# Patient Record
Sex: Male | Born: 1985 | Race: White | Hispanic: No | Marital: Single | Smoking: Current some day smoker
Health system: Southern US, Community
[De-identification: ages and names within clinical notes are randomized; demographics above are authoritative.]

## PROBLEM LIST (undated history)

## (undated) HISTORY — PX: DENTAL SURGERY: SHX609

## (undated) HISTORY — PX: SALIVARY GLAND SURGERY: SHX768

---

## 2016-10-28 ENCOUNTER — Other Ambulatory Visit: Payer: Self-pay | Admitting: Otolaryngology

## 2016-10-28 DIAGNOSIS — K1121 Acute sialoadenitis: Secondary | ICD-10-CM

## 2016-10-28 DIAGNOSIS — K115 Sialolithiasis: Secondary | ICD-10-CM

## 2016-11-05 ENCOUNTER — Ambulatory Visit
Admission: RE | Admit: 2016-11-05 | Discharge: 2016-11-05 | Disposition: A | Discharge status: Home / Self Care | Payer: Self-pay | Source: Ambulatory Visit | Attending: Otolaryngology | Admitting: Otolaryngology

## 2016-11-05 DIAGNOSIS — K1121 Acute sialoadenitis: Secondary | ICD-10-CM

## 2016-11-05 DIAGNOSIS — K118 Other diseases of salivary glands: Secondary | ICD-10-CM | POA: Insufficient documentation

## 2016-11-05 DIAGNOSIS — K115 Sialolithiasis: Secondary | ICD-10-CM

## 2016-11-05 MED ORDER — IOPAMIDOL (ISOVUE-300) INJECTION 61%
75.0000 mL | Freq: Once | INTRAVENOUS | Status: AC | PRN
  Administered 2016-11-05: 75 mL via INTRAVENOUS

## 2018-07-24 IMAGING — CT CT NECK W/ CM
2 of 3 series · 7 of 14 positions shown, 8 images · IV contrast (iopamidol)
Comparison: None.

CLINICAL DATA: Sialadenitis. Tongue swelling. Left neck pain.
History of submandibular gland resection as a child.

EXAM:
CT NECK WITH CONTRAST
TECHNIQUE: Multidetector CT imaging of the neck was performed using the
standard protocol following the bolus administration of intravenous
contrast.
CONTRAST:  75mL DJEVXK-4OO IOPAMIDOL (DJEVXK-4OO) INJECTION 61%

[Series 2: axial neck · axial · 0.62mm/px · z∈[-298,-178]mm · 3 of 120 slices shown]
[im 30/120  bone]
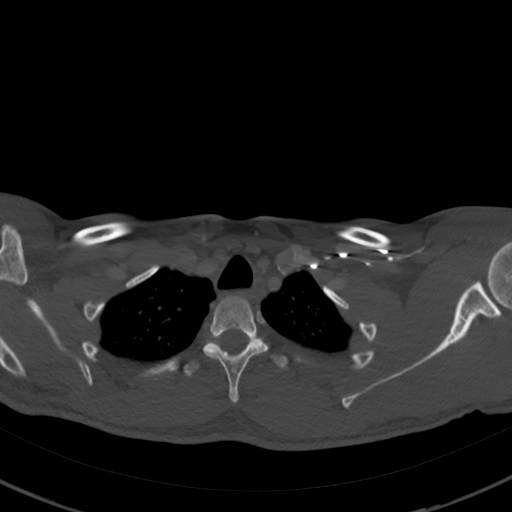
[im 60/120  bone]
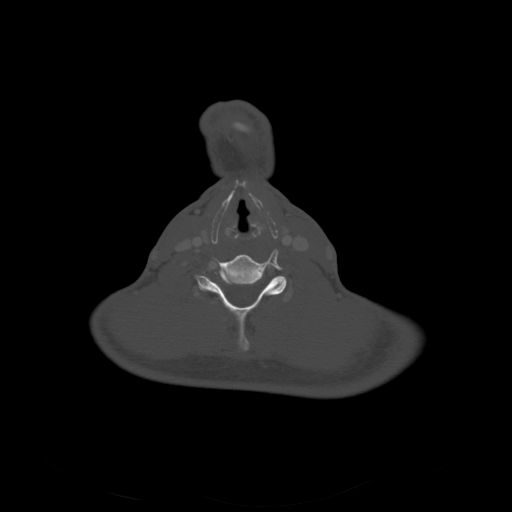
[im 90/120  bone]
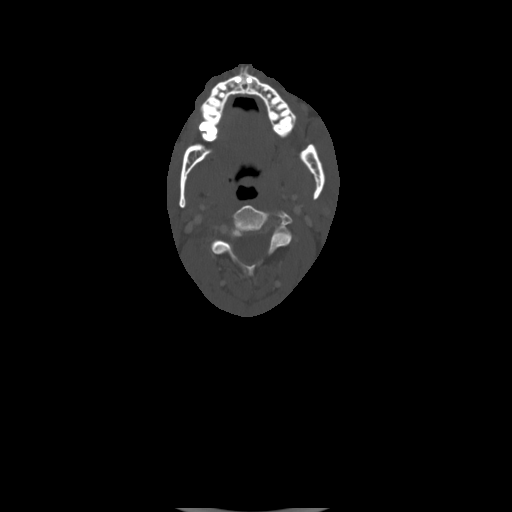

[Series 7: orthogonal ax · axial · 0.45mm/px · z∈[-345,-185]mm · 4 of 142 slices shown, 5 images]
[im 29/142  soft-tissue]
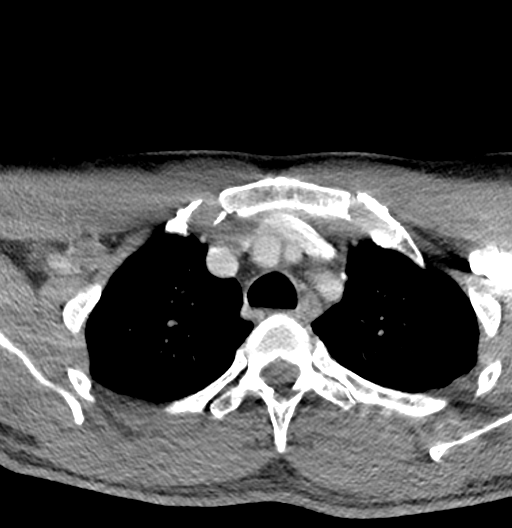
[im 29/142  bone]
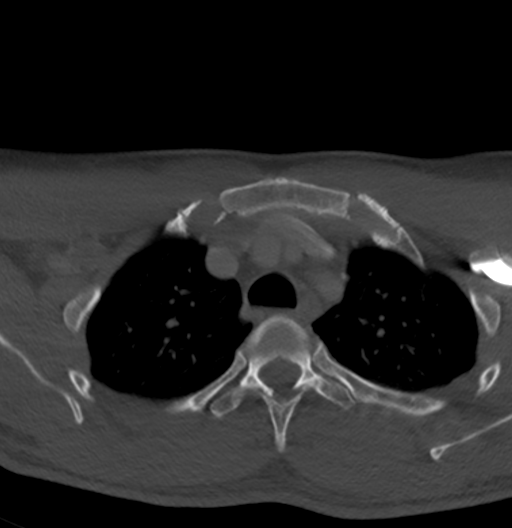
[im 57/142  bone]
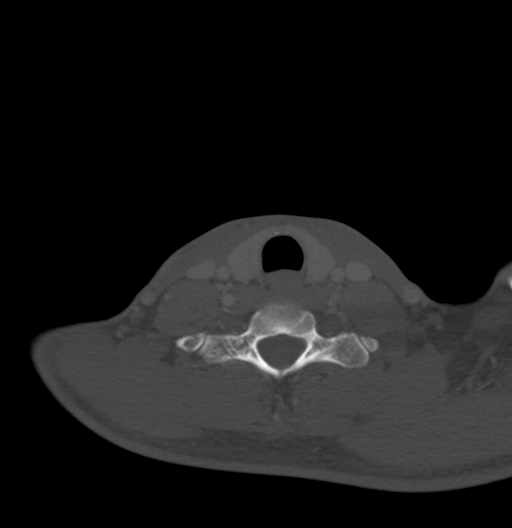
[im 85/142  bone]
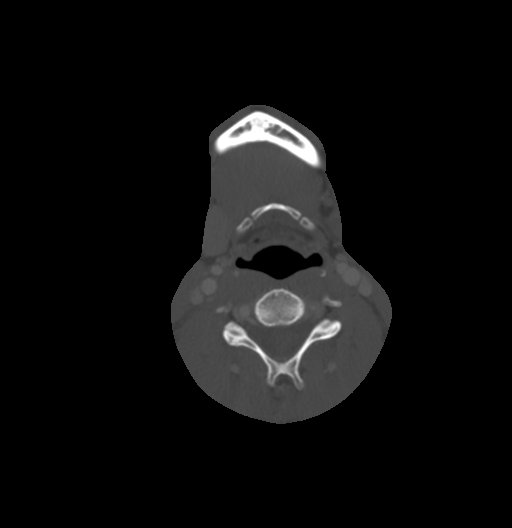
[im 113/142  bone]
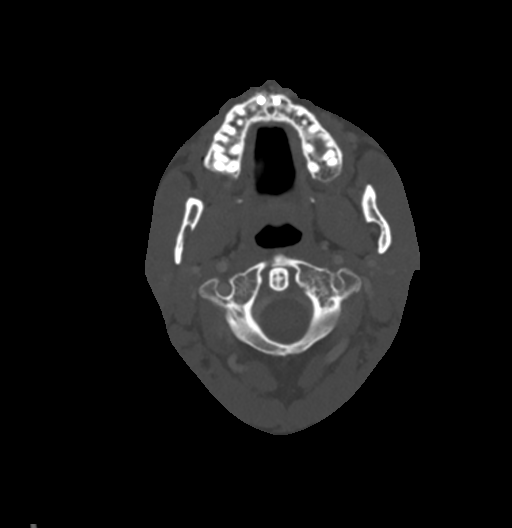

[7 of 14 positions shown; findings below may reference images not displayed]

FINDINGS: Pharynx and larynx: Symmetric pharyngeal soft tissues without
evidence of mass. No retropharyngeal fluid collection. Unremarkable
larynx.

Salivary glands: The parotid glands and right submandibular gland
are unremarkable. The left submandibular gland is absent. There is
mild diffuse dilatation of the mid and anterior aspects of the left
submandibular duct without calculi identified. No gross inflammatory
changes are identified in the floor of mouth or submandibular
spaces.

Thyroid: Unremarkable.

Lymph nodes: No enlarged or suspicious lymph nodes in the neck.

Vascular: Major vascular structures of the neck are patent. The left
vertebral artery is hypoplastic.

Limited intracranial: Unremarkable.

Visualized orbits: Unremarkable.

Mastoids and visualized paranasal sinuses: Mildly polypoid mucosal
thickening with internal calcification in the inferior right
maxillary sinus. Clear mastoid air cells.

Skeleton: No acute osseous abnormality or suspicious osseous lesion.

Upper chest: Clear lung apices.

Other: None.
IMPRESSION: Prior left submandibular gland resection. Mild dilatation of the
left submandibular duct without evidence of calculi.

## 2018-10-18 ENCOUNTER — Other Ambulatory Visit: Payer: Self-pay

## 2018-10-18 ENCOUNTER — Other Ambulatory Visit
Admission: RE | Admit: 2018-10-18 | Discharge: 2018-10-18 | Disposition: A | Discharge status: Home / Self Care | Payer: HRSA Program | Source: Ambulatory Visit | Attending: Otolaryngology | Admitting: Otolaryngology

## 2018-10-18 ENCOUNTER — Encounter: Payer: Self-pay | Admitting: *Deleted

## 2018-10-18 DIAGNOSIS — J342 Deviated nasal septum: Secondary | ICD-10-CM | POA: Insufficient documentation

## 2018-10-18 DIAGNOSIS — Z01812 Encounter for preprocedural laboratory examination: Secondary | ICD-10-CM | POA: Diagnosis not present

## 2018-10-18 DIAGNOSIS — Z20828 Contact with and (suspected) exposure to other viral communicable diseases: Secondary | ICD-10-CM | POA: Insufficient documentation

## 2018-10-18 LAB — SARS CORONAVIRUS 2 (TAT 6-24 HRS): SARS Coronavirus 2: NEGATIVE

## 2018-10-21 ENCOUNTER — Ambulatory Visit
Admission: RE | Admit: 2018-10-21 | Discharge: 2018-10-21 | Disposition: A | Discharge status: Home / Self Care | Payer: Self-pay | Attending: Otolaryngology | Admitting: Otolaryngology

## 2018-10-21 ENCOUNTER — Encounter
Admission: RE | Disposition: A | Discharge status: Home / Self Care | Payer: Self-pay | Source: Home / Self Care | Attending: Otolaryngology

## 2018-10-21 ENCOUNTER — Ambulatory Visit: Payer: Self-pay | Admitting: Anesthesiology

## 2018-10-21 ENCOUNTER — Other Ambulatory Visit: Payer: Self-pay

## 2018-10-21 DIAGNOSIS — J343 Hypertrophy of nasal turbinates: Secondary | ICD-10-CM | POA: Insufficient documentation

## 2018-10-21 DIAGNOSIS — Z79899 Other long term (current) drug therapy: Secondary | ICD-10-CM | POA: Insufficient documentation

## 2018-10-21 DIAGNOSIS — J342 Deviated nasal septum: Secondary | ICD-10-CM | POA: Insufficient documentation

## 2018-10-21 HISTORY — DX: Hemochromatosis, unspecified: E83.119

## 2018-10-21 HISTORY — PX: TURBINATE REDUCTION: SHX6157

## 2018-10-21 HISTORY — PX: SEPTOPLASTY: SHX2393

## 2018-10-21 SURGERY — SEPTOPLASTY, NOSE
Anesthesia: General | Site: Nose | Laterality: Bilateral

## 2018-10-21 MED ORDER — OXYCODONE HCL 5 MG PO TABS: 5.0000 mg | ORAL_TABLET | Freq: Once | ORAL | Status: AC | PRN

## 2018-10-21 MED ORDER — DEXAMETHASONE SODIUM PHOSPHATE 4 MG/ML IJ SOLN
INTRAMUSCULAR | Status: DC | PRN
  Administered 2018-10-21: 10 mg via INTRAVENOUS

## 2018-10-21 MED ORDER — SUCCINYLCHOLINE CHLORIDE 20 MG/ML IJ SOLN
INTRAMUSCULAR | Status: DC | PRN
  Administered 2018-10-21: 100 mg via INTRAVENOUS

## 2018-10-21 MED ORDER — FENTANYL CITRATE (PF) 100 MCG/2ML IJ SOLN: 25.0000 ug | INTRAMUSCULAR | Status: DC | PRN

## 2018-10-21 MED ORDER — LACTATED RINGERS IV SOLN
INTRAVENOUS | Status: DC
  Administered 2018-10-21 (×2): via INTRAVENOUS

## 2018-10-21 MED ORDER — DEXTROSE 5 % IV SOLN
2000.0000 mg | Freq: Once | INTRAVENOUS | Status: AC
  Administered 2018-10-21: 13:00:00 2000 mg via INTRAVENOUS

## 2018-10-21 MED ORDER — PROPOFOL 10 MG/ML IV BOLUS
INTRAVENOUS | Status: DC | PRN
  Administered 2018-10-21: 200 mg via INTRAVENOUS

## 2018-10-21 MED ORDER — GLYCOPYRROLATE 0.2 MG/ML IJ SOLN
INTRAMUSCULAR | Status: DC | PRN
  Administered 2018-10-21: 0.1 mg via INTRAVENOUS

## 2018-10-21 MED ORDER — ACETAMINOPHEN 10 MG/ML IV SOLN
1000.0000 mg | Freq: Once | INTRAVENOUS | Status: AC
  Administered 2018-10-21: 12:00:00 1000 mg via INTRAVENOUS

## 2018-10-21 MED ORDER — LIDOCAINE HCL (CARDIAC) PF 100 MG/5ML IV SOSY
PREFILLED_SYRINGE | INTRAVENOUS | Status: DC | PRN
  Administered 2018-10-21: 30 mg via INTRAVENOUS

## 2018-10-21 MED ORDER — ONDANSETRON HCL 4 MG/2ML IJ SOLN: 4.0000 mg | Freq: Once | INTRAMUSCULAR | Status: DC | PRN

## 2018-10-21 MED ORDER — LIDOCAINE-EPINEPHRINE 1 %-1:100000 IJ SOLN
INTRAMUSCULAR | Status: DC | PRN
  Administered 2018-10-21: 8 mL

## 2018-10-21 MED ORDER — ONDANSETRON HCL 4 MG/2ML IJ SOLN
INTRAMUSCULAR | Status: DC | PRN
  Administered 2018-10-21: 4 mg via INTRAVENOUS

## 2018-10-21 MED ORDER — ACETAMINOPHEN 10 MG/ML IV SOLN
INTRAVENOUS | Status: DC | PRN
  Administered 2018-10-21: 1000 mg via INTRAVENOUS

## 2018-10-21 MED ORDER — MIDAZOLAM HCL 5 MG/5ML IJ SOLN
INTRAMUSCULAR | Status: DC | PRN
  Administered 2018-10-21: 2 mg via INTRAVENOUS

## 2018-10-21 MED ORDER — PHENYLEPHRINE HCL 0.5 % NA SOLN
NASAL | Status: DC | PRN
  Administered 2018-10-21: 15 mL via TOPICAL

## 2018-10-21 MED ORDER — FENTANYL CITRATE (PF) 100 MCG/2ML IJ SOLN
INTRAMUSCULAR | Status: DC | PRN
  Administered 2018-10-21: 100 ug via INTRAVENOUS

## 2018-10-21 MED ORDER — OXYMETAZOLINE HCL 0.05 % NA SOLN
2.0000 | Freq: Once | NASAL | Status: AC
  Administered 2018-10-21: 13:00:00 2 via NASAL

## 2018-10-21 MED ORDER — SCOPOLAMINE 1 MG/3DAYS TD PT72
1.0000 | MEDICATED_PATCH | Freq: Once | TRANSDERMAL | Status: DC
  Administered 2018-10-21: 12:00:00 1.5 mg via TRANSDERMAL

## 2018-10-21 MED ORDER — OXYCODONE HCL 5 MG/5ML PO SOLN
5.0000 mg | Freq: Once | ORAL | Status: AC | PRN
  Administered 2018-10-21: 15:00:00 5 mg via ORAL

## 2018-10-21 SURGICAL SUPPLY — 24 items
CANISTER SUCT 1200ML W/VALVE (MISCELLANEOUS) ×3 IMPLANT
COAGULATOR SUCT 8FR VV (MISCELLANEOUS) ×3 IMPLANT
ELECT REM PT RETURN 9FT ADLT (ELECTROSURGICAL) ×3
ELECTRODE REM PT RTRN 9FT ADLT (ELECTROSURGICAL) ×1 IMPLANT
GLOVE PI ULTRA LF STRL 7.5 (GLOVE) ×2 IMPLANT
GLOVE PI ULTRA NON LATEX 7.5 (GLOVE) ×4
GOWN STRL REUS W/ TWL LRG LVL3 (GOWN DISPOSABLE) ×1 IMPLANT
GOWN STRL REUS W/TWL LRG LVL3 (GOWN DISPOSABLE) ×2
KIT TURNOVER KIT A (KITS) ×3 IMPLANT
NEEDLE ANESTHESIA  27G X 3.5 (NEEDLE) ×2
NEEDLE ANESTHESIA 27G X 3.5 (NEEDLE) ×1 IMPLANT
NEEDLE HYPO 27GX1-1/4 (NEEDLE) ×3 IMPLANT
PACK ENT CUSTOM (PACKS) ×3 IMPLANT
PATTIES SURGICAL .5 X3 (DISPOSABLE) ×3 IMPLANT
SPLINT NASAL SEPTAL BLV .50 ST (MISCELLANEOUS) ×3 IMPLANT
STRAP BODY AND KNEE 60X3 (MISCELLANEOUS) ×3 IMPLANT
SUT CHROMIC 3-0 (SUTURE)
SUT CHROMIC 3-0 KS 27XMFL CR (SUTURE)
SUT ETHILON 3-0 KS 30 BLK (SUTURE) ×3 IMPLANT
SUT PLAIN GUT 4-0 (SUTURE) ×3 IMPLANT
SUTURE CHRMC 3-0 KS 27XMFL CR (SUTURE) IMPLANT
SYR 3ML LL SCALE MARK (SYRINGE) ×3 IMPLANT
TOWEL OR 17X26 4PK STRL BLUE (TOWEL DISPOSABLE) ×3 IMPLANT
WATER STERILE IRR 250ML POUR (IV SOLUTION) ×3 IMPLANT

## 2018-10-21 NOTE — Op Note (Signed)
10/21/2018  2:44 PM [id number]   Pre-Op Dx:  Deviated Nasal Septum, Hypertrophic Inferior Turbinates  Post-op Dx: Same  Proc: Nasal Septoplasty, Bilateral Partial Reduction Inferior Turbinates   Surg:  Chad Brennan  Anes:  GOT  EBL: 50 mL  Comp: None  Findings: Septum buckled to the right side with maxillary crest and cartilage pushing into the right nasal airway.  Posteriorly he has a bony septal spur to his left side blocking some of the posterior airway on the left.  He has enlarged inferior turbinates  Procedure: With the patient in a comfortable supine position,  general orotracheal anesthesia was induced without difficulty.     The patient received preoperative Afrin spray for topical decongestion and vasoconstriction.  Intravenous prophylactic antibiotics were administered.  At an appropriate level, the patient was placed in a semi-sitting position.  Nasal vibrissae were trimmed.   1% Xylocaine with 1:100,000 epinephrine, 8 cc's, was infiltrated into the anterior floor of the nose, into the nasal spine region, into the membranous columella, and finally into the submucoperichondrial plane of the septum on both sides.  Several minutes were allowed for this to take effect.  Cottoniod pledgetts soaked in Afrin and 4% Xylocaine were placed into both nasal cavities and left while the patient was prepped and draped in the standard fashion.  The materials were removed from the nose and observed to be intact and correct in number.  The nose was inspected with a headlight and zero degree scope with the findings as described above.  A left Killian incision was sharply executed and carried down to the quadrangular cartilage. The mucoperichondrium was elelvated along the quadrangular plate back to the bony-cartilaginous junction. The mucoperiostium was then elevated along the ethmoid plate and the vomer. The boney-catilaginous junction was then split with a freer elevator and the  mucoperiosteum was elevated on the opposite side. The mucoperiosteum was then elevated along the maxillary crest as needed to expose the crooked bone of the crest.  Boney spurs of the vomer and maxillary crest were removed with Lenoria Chime forceps.  The cartilaginous plate was trimmed along its posterior and inferior borders of about 2 mm of cartilage to free it up inferiorly. Some of the deviated ethmoid plate was then fractured and removed with Takahashi forceps to free up the posterior border of the quadrangular plate and allow it to swing back to the midline. The mucosal flaps were placed back into their anatomic position to allow visualization of the airways. The septum now sat in the midline with an improved airway.  A 3-0 Chromic suture on a Keith needle in used to anchor the inferior septum at the nasal spine with a through and through suture. The mucosal flaps are then sutured together using a through and through whip stitch of 4-0 [place] with a mini-Keith needle. This was used to close the [place] incision as well.   The inferior turbinates were then inspected. An incision was created along the inferior aspect of the left inferior turbinate with removal of some of the inferior soft tissue and bone. Electrocautery was used to control bleeding in the area. The remaining turbinate was then outfractured to open up the airway further. There was no significant bleeding noted. The right turbinate was then trimmed and outfractured in a similar fashion.  The airways were then visualized and showed open passageways on both sides that were significantly improved compared to before surgery. There was no signifcant bleeding. Nasal splints were applied to both sides  of the septum using Xomed 0.31mm regular sized splints that were trimmed, and then held in position with a 3-0 Nylon through and through suture.  The patient was turned back over to anesthesia, and awakened, extubated, and taken to the PACU in  satisfactory condition.  Dispo:   PACU to home  Plan: Ice, elevation, narcotic analgesia, steroid taper, and prophylactic antibiotics for the duration of indwelling nasal foreign bodies.  We will reevaluate the patient in the office in 6 days and remove the septal splints.  Return to work in 10 days, strenuous activities in two weeks.   Elon Alas Joffrey Kerce 10/21/2018 2:44 PM

## 2018-10-21 NOTE — Discharge Instructions (Signed)
[facility] ENDOSCOPIC SINUS SURGERY [facility]  What is Functional Endoscopic Sinus Surgery?  The Surgery involves making the natural openings of the sinuses larger by removing the bony partitions that separate the sinuses from the nasal cavity.  The natural sinus lining is preserved as much as possible to allow the sinuses to resume normal function after the surgery.  In some patients nasal polyps (excessively swollen lining of the sinuses) may be removed to relieve obstruction of the sinus openings.  The surgery is performed through the nose using lighted scopes, which eliminates the need for incisions on the face.  A septoplasty is a different procedure which is sometimes performed with sinus surgery.  It involves straightening the boy partition that separates the two sides of your nose.  A crooked or deviated septum may need repair if is obstructing the sinuses or nasal airflow.  Turbinate reduction is also often performed during sinus surgery.  The turbinates are bony proturberances from the side walls of the nose which swell and can obstruct the nose in patients with sinus and allergy problems.  Their size can be surgically reduced to help relieve nasal obstruction.  What Can Sinus Surgery Do For Me?  Sinus surgery can reduce the frequency of sinus infections requiring antibiotic treatment.  This can provide improvement in nasal congestion, post-nasal drainage, facial pressure and nasal obstruction.  Surgery will NOT prevent you from ever having an infection again, so it usually only for patients who get infections 4 or more times yearly requiring antibiotics, or for infections that do not clear with antibiotics.  It will not cure nasal allergies, so patients with allergies may still require medication to treat their allergies after surgery. Surgery may improve headaches related to sinusitis, however, some people will continue to  require medication to control sinus headaches related to allergies.  Surgery will do nothing for other forms of headache (migraine, tension or cluster).  What Are the Risks of Endoscopic Sinus Surgery?  Current techniques allow surgery to be performed safely with little risk, however, there are rare complications that patients should be aware of.  Because the sinuses are located around the eyes, there is risk of eye injury, including blindness, though again, this would be quite rare. This is usually a result of bleeding behind the eye during surgery, which puts the vision oat risk, though there are treatments to protect the vision and prevent permanent disrupted by surgery causing a leak of the spinal fluid that surrounds the brain.  More serious complications would include bleeding inside the brain cavity or damage to the brain.  Again, all of these complications are uncommon, and spinal fluid leaks can be safely managed surgically if they occur.  The most common complication of sinus surgery is bleeding from the nose, which may require packing or cauterization of the nose.  Continued sinus have polyps may experience recurrence of the polyps requiring revision surgery.  Alterations of sense of smell or injury to the tear ducts are also rare complications.   What is the Surgery Like, and what is the Recovery?  The Surgery usually takes a couple of hours to perform, and is usually performed under a general anesthetic (completely asleep).  Patients are usually discharged home after a couple of hours.  Sometimes during surgery it is necessary to pack the nose to control bleeding, and the packing is left in place for 24 - 48 hours, and removed by your surgeon.    If a septoplasty was performed during the procedure, there is often a splint placed which must be removed after 5-7 days.   Discomfort: Pain is usually mild to moderate, and can be controlled by prescription pain medication or acetaminophen (Tylenol).   Aspirin, Ibuprofen (Advil, Motrin), or Naprosyn (Aleve) should be avoided, as they can cause increased bleeding.  Most patients feel sinus pressure like they have a bad head cold for several days.  Sleeping with your head elevated can help reduce swelling and facial pressure, as can ice packs over the face.  A humidifier may be helpful to keep the mucous and blood from drying in the nose.   Diet: There are no specific diet restrictions, however, you should generally start with clear liquids and a light diet of bland foods because the anesthetic can cause some nausea.  Advance your diet depending on how your stomach feels.  Taking your pain medication with food will often help reduce stomach upset which pain medications can cause.  Nasal Saline Irrigation: It is important to remove blood clots and dried mucous from the nose as it is healing.  This is done by having you irrigate the nose at least 3 - 4 times daily with a salt water solution.  We recommend using NeilMed Sinus Rinse (available at the drug store).  Fill the squeeze bottle with the solution, bend over a sink, and insert the tip of the squeeze bottle into the nose  of an inch.  Point the tip of the squeeze bottle towards the inside corner of the eye on the same side your irrigating.  Squeeze the bottle and gently irrigate the nose.  If you bend forward as you do this, most of the fluid will flow back out of the nose, instead of down your throat.   The solution should be warm, near body temperature, when you irrigate.   Each time you irrigate, you should use a full squeeze bottle.   Note that if you are instructed to use Nasal Steroid Sprays at any time after your surgery, irrigate with saline BEFORE using the steroid spray, so you do not wash it all out of the nose. Another product, Nasal Saline Gel (such as AYR Nasal Saline Gel) can be applied in each nostril 3 - 4 times daily to moisture the nose and reduce scabbing or crusting.  Bleeding:   Bloody drainage from the nose can be expected for several days, and patients are instructed to irrigate their nose frequently with salt water to help remove mucous and blood clots.  The drainage may be dark red or brown, though some fresh blood may be seen intermittently, especially after irrigation.  Do not blow you nose, as bleeding may occur. If you must sneeze, keep your mouth open to allow air to escape through your mouth.  If heavy bleeding occurs: Irrigate the nose with saline to rinse out clots, then spray the nose 3 - 4 times with Afrin Nasal Decongestant Spray.  The spray will constrict the blood vessels to slow bleeding.  Pinch the lower half of your nose shut to apply pressure, and lay down with your head elevated.  Ice packs over the nose may help as well. If bleeding persists despite these measures, you should notify your doctor.  Do not use the Afrin routinely to control nasal congestion after surgery, as it can result in worsening congestion and may affect healing.     Activity: Return to work varies among patients. Most patients will be   out of work at least 5 - 7 days to recover.  Patient may return to work after they are off of narcotic pain medication, and feeling well enough to perform the functions of their job.  Patients must avoid heavy lifting (over 10 pounds) or strenuous physical for 2 weeks after surgery, so your employer may need to assign you to light duty, or keep you out of work longer if light duty is not possible.  NOTE: you should not drive, operate dangerous machinery, do any mentally demanding tasks or make any important legal or financial decisions while on narcotic pain medication and recovering from the general anesthetic.    Call Your Doctor Immediately if You Have Any of the Following: 1. Bleeding that you cannot control with the above measures 2. Loss of vision, double vision, bulging of the eye or black eyes. 3. Fever over 101 degrees 4. Neck stiffness with  severe headache, fever, nausea and change in mental state. You are always encourage to call anytime with concerns, however, please call with requests for pain medication refills during office hours.  Office Endoscopy: During follow-up visits your doctor will remove any packing or splints that may have been placed and evaluate and clean your sinuses endoscopically.  Topical anesthetic will be used to make this as comfortable as possible, though you may want to take your pain medication prior to the visit.  How often this will need to be done varies from patient to patient.  After complete recovery from the surgery, you may need follow-up endoscopy from time to time, particularly if there is concern of recurrent infection or nasal polyps.  Scopolamine skin patches REMOVE PATCH IN 72 HOURS AND WASH HANDS IMMEDIATELY  What is this medicine? SCOPOLAMINE (skoe POL a meen) is used to prevent nausea and vomiting caused by motion sickness, anesthesia and surgery. This medicine may be used for other purposes; ask your health care provider or pharmacist if you have questions. COMMON BRAND NAME(S): Transderm Scop What should I tell my health care provider before I take this medicine? They need to know if you have any of these conditions:  are scheduled to have a gastric secretion test  glaucoma  heart disease  kidney disease  liver disease  lung or breathing disease, like asthma  mental illness  prostate disease  seizures  stomach or intestine problems  trouble passing urine  an unusual or allergic reaction to scopolamine, atropine, other medicines, foods, dyes, or preservatives  pregnant or trying to get pregnant  breast-feeding How should I use this medicine? This medicine is for external use only. Follow the directions on the prescription label. Wear only 1 patch at a time. Choose an area behind the ear, that is clean, dry, hairless and free from any cuts or irritation. Wipe the  area with a clean dry tissue. Peel off the plastic backing of the skin patch, trying not to touch the adhesive side with your hands. Do not cut the patches. Firmly apply to the area you have chosen, with the metallic side of the patch to the skin and the tan-colored side showing. Once firmly in place, wash your hands well with soap and water. Do not get this medicine into your eyes. After removing the patch, wash your hands and the area behind your ear thoroughly with soap and water. The patch will still contain some medicine after use. To avoid accidental contact or ingestion by children or pets, fold the used patch in half with the sticky   side together and throw away in the trash out of the reach of children and pets. If you need to use a second patch after you remove the first, place it behind the other ear. A special MedGuide will be given to you by the pharmacist with each prescription and refill. Be sure to read this information carefully each time. Talk to your pediatrician regarding the use of this medicine in children. Special care may be needed. Overdosage: If you think you have taken too much of this medicine contact a poison control center or emergency room at once. NOTE: This medicine is only for you. Do not share this medicine with others. What if I miss a dose? This does not apply. This medicine is not for regular use. What may interact with this medicine?  alcohol  antihistamines for allergy cough and cold  atropine  certain medicines for anxiety or sleep  certain medicines for bladder problems like oxybutynin, tolterodine  certain medicines for depression like amitriptyline, fluoxetine, sertraline  certain medicines for stomach problems like dicyclomine, hyoscyamine  certain medicines for Parkinson's disease like benztropine, trihexyphenidyl  certain medicines for seizures like phenobarbital, primidone  general anesthetics like halothane, isoflurane, methoxyflurane,  propofol  ipratropium  local anesthetics like lidocaine, pramoxine, tetracaine  medicines that relax muscles for surgery  phenothiazines like chlorpromazine, mesoridazine, prochlorperazine, thioridazine  narcotic medicines for pain  other belladonna alkaloids This list may not describe all possible interactions. Give your health care provider a list of all the medicines, herbs, non-prescription drugs, or dietary supplements you use. Also tell them if you smoke, drink alcohol, or use illegal drugs. Some items may interact with your medicine. What should I watch for while using this medicine? Limit contact with water while swimming and bathing because the patch may fall off. If the patch falls off, throw it away and put a new one behind the other ear. You may get drowsy or dizzy. Do not drive, use machinery, or do anything that needs mental alertness until you know how this medicine affects you. Do not stand or sit up quickly, especially if you are an older patient. This reduces the risk of dizzy or fainting spells. Alcohol may interfere with the effect of this medicine. Avoid alcoholic drinks. Your mouth may get dry. Chewing sugarless gum or sucking hard candy, and drinking plenty of water may help. Contact your healthcare professional if the problem does not go away or is severe. This medicine may cause dry eyes and blurred vision. If you wear contact lenses, you may feel some discomfort. Lubricating drops may help. See your healthcare professional if the problem does not go away or is severe. If you are going to need surgery, an MRI, CT scan, or other procedure, tell your healthcare professional that you are using this medicine. You may need to remove the patch before the procedure. What side effects may I notice from receiving this medicine? Side effects that you should report to your doctor or health care professional as soon as possible:  allergic reactions like skin rash, itching or  hives; swelling of the face, lips, or tongue  blurred vision  changes in vision  confusion  dizziness  eye pain  fast, irregular heartbeat  hallucinations, loss of contact with reality  nausea, vomiting  pain or trouble passing urine  restlessness  seizures  skin irritation  stomach pain Side effects that usually do not require medical attention (report to your doctor or health care professional if they continue or are   bothersome):  drowsiness  dry mouth  headache  sore throat This list may not describe all possible side effects. Call your doctor for medical advice about side effects. You may report side effects to FDA at 1-800-FDA-1088. Where should I keep my medicine? Keep out of the reach of children. Store at room temperature between 20 and 25 degrees C (68 and 77 degrees F). Keep this medicine in the foil package until ready to use. Throw away any unused medicine after the expiration date. NOTE: This sheet is a summary. It may not cover all possible information. If you have questions about this medicine, talk to your doctor, pharmacist, or health care provider.  2020 Elsevier/Gold Standard (2017-04-03 16:14:46)   General Anesthesia, Adult, Care After This sheet gives you information about how to care for yourself after your procedure. Your health care provider may also give you more specific instructions. If you have problems or questions, contact your health care provider. What can I expect after the procedure? After the procedure, the following side effects are common:  Pain or discomfort at the IV site.  Nausea.  Vomiting.  Sore throat.  Trouble concentrating.  Feeling cold or chills.  Weak or tired.  Sleepiness and fatigue.  Soreness and body aches. These side effects can affect parts of the body that were not involved in surgery. Follow these instructions at home:  For at least 24 hours after the procedure:  Have a responsible adult  stay with you. It is important to have someone help care for you until you are awake and alert.  Rest as needed.  Do not: ? Participate in activities in which you could fall or become injured. ? Drive. ? Use heavy machinery. ? Drink alcohol. ? Take sleeping pills or medicines that cause drowsiness. ? Make important decisions or sign legal documents. ? Take care of children on your own. Eating and drinking  Follow any instructions from your health care provider about eating or drinking restrictions.  When you feel hungry, start by eating small amounts of foods that are soft and easy to digest (bland), such as toast. Gradually return to your regular diet.  Drink enough fluid to keep your urine pale yellow.  If you vomit, rehydrate by drinking water, juice, or clear broth. General instructions  If you have sleep apnea, surgery and certain medicines can increase your risk for breathing problems. Follow instructions from your health care provider about wearing your sleep device: ? Anytime you are sleeping, including during daytime naps. ? While taking prescription pain medicines, sleeping medicines, or medicines that make you drowsy.  Return to your normal activities as told by your health care provider. Ask your health care provider what activities are safe for you.  Take over-the-counter and prescription medicines only as told by your health care provider.  If you smoke, do not smoke without supervision.  Keep all follow-up visits as told by your health care provider. This is important. Contact a health care provider if:  You have nausea or vomiting that does not get better with medicine.  You cannot eat or drink without vomiting.  You have pain that does not get better with medicine.  You are unable to pass urine.  You develop a skin rash.  You have a fever.  You have redness around your IV site that gets worse. Get help right away if:  You have difficulty  breathing.  You have chest pain.  You have blood in your urine or stool, or   you vomit blood. Summary  After the procedure, it is common to have a sore throat or nausea. It is also common to feel tired.  Have a responsible adult stay with you for the first 24 hours after general anesthesia. It is important to have someone help care for you until you are awake and alert.  When you feel hungry, start by eating small amounts of foods that are soft and easy to digest (bland), such as toast. Gradually return to your regular diet.  Drink enough fluid to keep your urine pale yellow.  Return to your normal activities as told by your health care provider. Ask your health care provider what activities are safe for you. This information is not intended to replace advice given to you by your health care provider. Make sure you discuss any questions you have with your health care provider. Document Released: 04/21/2000 Document Revised: 01/16/2017 Document Reviewed: 08/29/2016 Elsevier Patient Education  2020 Elsevier Inc.  

## 2018-10-21 NOTE — Anesthesia Procedure Notes (Signed)
Procedure Name: Intubation Date/Time: 10/21/2018 1:53 PM Performed by: Georga Bora, CRNA Pre-anesthesia Checklist: Patient identified, Emergency Drugs available, Suction available, Patient being monitored and Timeout performed Patient Re-evaluated:Patient Re-evaluated prior to induction Oxygen Delivery Method: Circle system utilized Preoxygenation: Pre-oxygenation with 100% oxygen Induction Type: IV induction Ventilation: Mask ventilation without difficulty Laryngoscope Size: Miller and 2 Grade View: Grade II Tube type: Oral Rae Tube size: 7.5 mm Number of attempts: 1 Placement Confirmation: ETT inserted through vocal cords under direct vision,  positive ETCO2 and breath sounds checked- equal and bilateral Tube secured with: Tape Dental Injury: Teeth and Oropharynx as per pre-operative assessment

## 2018-10-21 NOTE — Anesthesia Preprocedure Evaluation (Signed)
Anesthesia Evaluation  Patient identified by MRN, date of birth, ID band Patient awake    Reviewed: Allergy & Precautions, H&P , NPO status , Patient's Chart, lab work & pertinent test results  Airway Mallampati: II  TM Distance: >3 FB Neck ROM: full    Dental no notable dental hx.    Pulmonary Current Smoker,    Pulmonary exam normal breath sounds clear to auscultation       Cardiovascular Normal cardiovascular exam Rhythm:regular Rate:Normal     Neuro/Psych    GI/Hepatic   Endo/Other    Renal/GU      Musculoskeletal   Abdominal   Peds  Hematology   Anesthesia Other Findings   Reproductive/Obstetrics                             Anesthesia Physical Anesthesia Plan  ASA: II  Anesthesia Plan: General ETT   Post-op Pain Management:    Induction:   PONV Risk Score and Plan: 2 and Ondansetron, Dexamethasone, Scopolamine patch - Pre-op and Treatment may vary due to age or medical condition  Airway Management Planned:   Additional Equipment:   Intra-op Plan:   Post-operative Plan:   Informed Consent: I have reviewed the patients History and Physical, chart, labs and discussed the procedure including the risks, benefits and alternatives for the proposed anesthesia with the patient or authorized representative who has indicated his/her understanding and acceptance.       Plan Discussed with: CRNA  Anesthesia Plan Comments:         Anesthesia Quick Evaluation

## 2018-10-21 NOTE — H&P (Signed)
H&P has been reviewed and patient reevaluated, no changes necessary. To be downloaded later.  

## 2018-10-21 NOTE — Anesthesia Postprocedure Evaluation (Signed)
Anesthesia Post Note  Patient: Chad Brennan.  Procedure(s) Performed: SEPTOPLASTY (Bilateral Nose) TURBINATE REDUCTION (Bilateral Nose)  Patient location during evaluation: PACU Anesthesia Type: General Level of consciousness: awake and alert and oriented Pain management: satisfactory to patient Vital Signs Assessment: post-procedure vital signs reviewed and stable Respiratory status: spontaneous breathing, nonlabored ventilation and respiratory function stable Cardiovascular status: blood pressure returned to baseline and stable Postop Assessment: Adequate PO intake and No signs of nausea or vomiting Anesthetic complications: no    Raliegh Ip

## 2018-10-21 NOTE — Addendum Note (Signed)
Addendum  created 10/21/18 1713 by Ronelle Nigh, MD   Intraprocedure Event edited, Intraprocedure Staff edited

## 2018-10-21 NOTE — Transfer of Care (Signed)
Immediate Anesthesia Transfer of Care Note  Patient: Chad Brennan.  Procedure(s) Performed: SEPTOPLASTY (Bilateral Nose) TURBINATE REDUCTION (Bilateral Nose)  Patient Location: PACU  Anesthesia Type: General ETT  Level of Consciousness: awake, alert  and patient cooperative  Airway and Oxygen Therapy: Patient Spontanous Breathing and Patient connected to supplemental oxygen  Post-op Assessment: Post-op Vital signs reviewed, Patient's Cardiovascular Status Stable, Respiratory Function Stable, Patent Airway and No signs of Nausea or vomiting  Post-op Vital Signs: Reviewed and stable  Complications: No apparent anesthesia complications

## 2018-10-22 ENCOUNTER — Encounter: Payer: Self-pay | Admitting: Otolaryngology
